# Patient Record
Sex: Female | Born: 1950 | Race: Black or African American | Hispanic: No | Marital: Single | Smoking: Never smoker
Health system: Southern US, Community
[De-identification: ages and names within clinical notes are randomized; demographics above are authoritative.]

## PROBLEM LIST (undated history)

## (undated) DIAGNOSIS — I10 Essential (primary) hypertension: Secondary | ICD-10-CM

## (undated) DIAGNOSIS — E7800 Pure hypercholesterolemia, unspecified: Secondary | ICD-10-CM

## (undated) HISTORY — PX: CATARACT EXTRACTION, BILATERAL: SHX1313

## (undated) HISTORY — PX: OTHER SURGICAL HISTORY: SHX169

## (undated) HISTORY — PX: ABDOMINAL HYSTERECTOMY: SHX81

## (undated) HISTORY — PX: VARICOSE VEIN SURGERY: SHX832

---

## 2017-07-13 HISTORY — PX: BLADDER SURGERY: SHX569

## 2019-02-06 ENCOUNTER — Emergency Department (HOSPITAL_COMMUNITY): Payer: Medicare PPO

## 2019-02-06 ENCOUNTER — Other Ambulatory Visit: Payer: Self-pay

## 2019-02-06 ENCOUNTER — Encounter (HOSPITAL_COMMUNITY): Payer: Self-pay | Admitting: Emergency Medicine

## 2019-02-06 ENCOUNTER — Emergency Department (HOSPITAL_COMMUNITY)
Admission: EM | Admit: 2019-02-06 | Discharge: 2019-02-06 | Disposition: A | Discharge status: Home / Self Care | Payer: Medicare PPO | Attending: Emergency Medicine | Admitting: Emergency Medicine

## 2019-02-06 DIAGNOSIS — R1030 Lower abdominal pain, unspecified: Secondary | ICD-10-CM | POA: Diagnosis not present

## 2019-02-06 DIAGNOSIS — M545 Low back pain: Secondary | ICD-10-CM | POA: Diagnosis not present

## 2019-02-06 DIAGNOSIS — Z8673 Personal history of transient ischemic attack (TIA), and cerebral infarction without residual deficits: Secondary | ICD-10-CM | POA: Insufficient documentation

## 2019-02-06 DIAGNOSIS — Y998 Other external cause status: Secondary | ICD-10-CM | POA: Insufficient documentation

## 2019-02-06 DIAGNOSIS — Y9389 Activity, other specified: Secondary | ICD-10-CM | POA: Insufficient documentation

## 2019-02-06 DIAGNOSIS — N6321 Unspecified lump in the left breast, upper outer quadrant: Secondary | ICD-10-CM | POA: Diagnosis not present

## 2019-02-06 DIAGNOSIS — I10 Essential (primary) hypertension: Secondary | ICD-10-CM | POA: Diagnosis not present

## 2019-02-06 DIAGNOSIS — Y92410 Unspecified street and highway as the place of occurrence of the external cause: Secondary | ICD-10-CM | POA: Insufficient documentation

## 2019-02-06 DIAGNOSIS — N63 Unspecified lump in unspecified breast: Secondary | ICD-10-CM

## 2019-02-06 DIAGNOSIS — S8992XA Unspecified injury of left lower leg, initial encounter: Secondary | ICD-10-CM | POA: Diagnosis present

## 2019-02-06 DIAGNOSIS — S8012XA Contusion of left lower leg, initial encounter: Secondary | ICD-10-CM | POA: Diagnosis not present

## 2019-02-06 HISTORY — DX: Pure hypercholesterolemia, unspecified: E78.00

## 2019-02-06 HISTORY — DX: Essential (primary) hypertension: I10

## 2019-02-06 MED ORDER — ACETAMINOPHEN 325 MG PO TABS
650.0000 mg | ORAL_TABLET | Freq: Once | ORAL | Status: AC
  Administered 2019-02-06: 650 mg via ORAL
  Filled 2019-02-06: qty 2

## 2019-02-06 NOTE — ED Notes (Signed)
Discharge instructions discussed with Pt. Pt verbalized understanding. Pt stable and leaving via [place] with friend.

## 2019-02-06 NOTE — Progress Notes (Addendum)
Received call from [employer] and they were attempting to deliver DME to hospital room and pt dc. Attempted to call pt at home but no answer or voice mail. Jonnie Finner RN CCM Case Mgmt phone [phone]

## 2019-02-06 NOTE — ED Notes (Signed)
Patient transported to CT 

## 2019-02-06 NOTE — Progress Notes (Signed)
TOC CM contacted [facility] for RW and 3n1 bedside commode for home to be delivered to her in [facility] ED prior to dc. Jonnie Finner RN CCM Case Mgmt phone [phone]
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
## 2019-02-06 NOTE — Discharge Instructions (Signed)
You had a CT scan performed in the Emergency Department today.  The CT showed a nodule on your left breast that will need to be followed up by your family doctor - you may need a mammogram.  Your CT scan also shows signs of an old stroke as well as arthritis in your back.    You can take tylenol, available over the counter according to label instructions as needed for pain.

## 2019-02-06 NOTE — ED Notes (Signed)
Pt atempted to walk, she just jump on right leg. Refer pain on left leg, unable to put weight on left side.

## 2019-02-07 ENCOUNTER — Telehealth: Payer: Self-pay | Admitting: *Deleted
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
